# Patient Record
Sex: Male | Born: 1985 | Race: White | Hispanic: No | Marital: Single | Smoking: Current every day smoker
Health system: Southern US, Community
[De-identification: ages and names within clinical notes are randomized; demographics above are authoritative.]

## PROBLEM LIST (undated history)

## (undated) HISTORY — PX: BLADDER SURGERY: SHX569

---
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
## 2014-02-25 ENCOUNTER — Emergency Department (HOSPITAL_COMMUNITY): Payer: Self-pay

## 2014-02-25 ENCOUNTER — Emergency Department (HOSPITAL_COMMUNITY)
Admission: EM | Admit: 2014-02-25 | Discharge: 2014-02-25 | Disposition: A | Discharge status: Home / Self Care | Payer: Self-pay | Attending: Emergency Medicine | Admitting: Emergency Medicine

## 2014-02-25 ENCOUNTER — Encounter (HOSPITAL_COMMUNITY): Payer: Self-pay | Admitting: Emergency Medicine

## 2014-02-25 DIAGNOSIS — R0789 Other chest pain: Secondary | ICD-10-CM | POA: Insufficient documentation

## 2014-02-25 DIAGNOSIS — Z720 Tobacco use: Secondary | ICD-10-CM | POA: Insufficient documentation

## 2014-02-25 DIAGNOSIS — J40 Bronchitis, not specified as acute or chronic: Secondary | ICD-10-CM | POA: Insufficient documentation

## 2014-02-25 MED ORDER — PROMETHAZINE-CODEINE 6.25-10 MG/5ML PO SYRP: 5.0000 mL | ORAL_SOLUTION | Freq: Four times a day (QID) | ORAL | Status: AC | PRN

## 2014-02-25 MED ORDER — DICLOFENAC SODIUM 75 MG PO TBEC: 75.0000 mg | DELAYED_RELEASE_TABLET | Freq: Two times a day (BID) | ORAL | Status: AC

## 2014-02-25 MED ORDER — ALBUTEROL SULFATE HFA 108 (90 BASE) MCG/ACT IN AERS
2.0000 | INHALATION_SPRAY | Freq: Once | RESPIRATORY_TRACT | Status: AC
  Administered 2014-02-25: 2 via RESPIRATORY_TRACT
  Filled 2014-02-25: qty 6.7

## 2014-02-25 MED ORDER — DEXAMETHASONE 4 MG PO TABS: ORAL_TABLET | ORAL | Status: AC

## 2014-02-25 NOTE — Discharge Instructions (Signed)
Your chest x-ray is negative for fracture, dislocation, or pneumonia. Please use albuterol 2 puffs every 4 hours, use Decadron and diclofenac 2 times daily with food. Use promethazine codeine cough medication for cough. This medication may cause drowsiness, please use with caution. Chest Wall Pain Chest wall pain is pain in or around the bones and muscles of your chest. It may take up to 6 weeks to get better. It may take longer if you must stay physically active in your work and activities.  CAUSES  Chest wall pain may happen on its own. However, it may be caused by:  A viral illness like the flu.  Injury.  Coughing.  Exercise.  Arthritis.  Fibromyalgia.  Shingles. HOME CARE INSTRUCTIONS   Avoid overtiring physical activity. Try not to strain or perform activities that cause pain. This includes any activities using your chest or your abdominal and side muscles, especially if heavy weights are used.  Put ice on the sore area.  Put ice in a plastic bag.  Place a towel between your skin and the bag.  Leave the ice on for 15-20 minutes per hour while awake for the first 2 days.  Only take over-the-counter or prescription medicines for pain, discomfort, or fever as directed by your caregiver. SEEK IMMEDIATE MEDICAL CARE IF:   Your pain increases, or you are very uncomfortable.  You have a fever.  Your chest pain becomes worse.  You have new, unexplained symptoms.  You have nausea or vomiting.  You feel sweaty or lightheaded.  You have a cough with phlegm (sputum), or you cough up blood. MAKE SURE YOU:   Understand these instructions.  Will watch your condition.  Will get help right away if you are not doing well or get worse. Document Released: 05/04/2005 Document Revised: 07/27/2011 Document Reviewed: 12/29/2010 [facility] Patient Information 2015 [employer] [place]. This information is not intended to replace advice given to you by your health care provider. Make sure  you discuss any questions you have with your health care provider.

## 2014-02-25 NOTE — ED Notes (Signed)
Pt reports left sided rib pain x4-5 days. Pt reports pain is worse with movement. Pt reports pain ever since played in golf tournament x4 days ago.
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
## 2014-02-25 NOTE — ED Notes (Signed)
Discharge instructions given, pt demonstrated teach back and verbal understanding. No concerns voiced.  

## 2014-02-27 NOTE — ED Provider Notes (Signed)
Medical screening examination/treatment/procedure(s) were performed by non-physician practitioner and as supervising physician I was immediately available for consultation/collaboration.     Geoffery Lyonsouglas Kenslee Achorn, MD 02/27/14 [phone]

## 2015-04-19 IMAGING — CR DG RIBS W/ CHEST 3+V*L*
4 series · 4 of 4 positions shown · non-contrast
Comparison: Chest x-ray 09/06/2010

CLINICAL DATA: Anterior rib pain.  No known injury.

EXAM:
LEFT RIBS AND CHEST - 3+ VIEW

[view not recorded (1 of 4)]
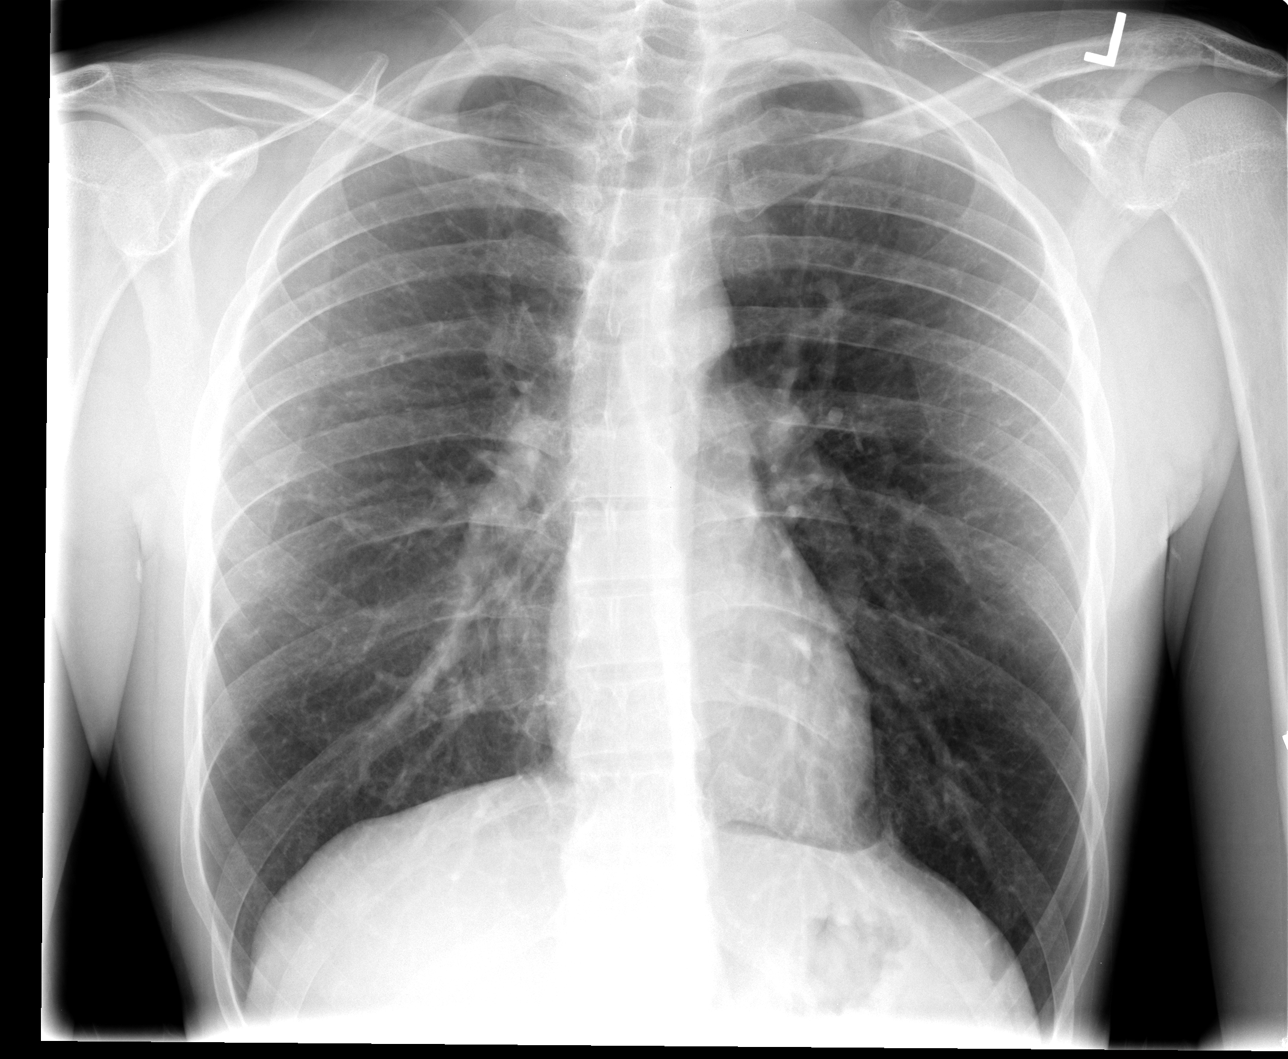

[view not recorded (2 of 4)]
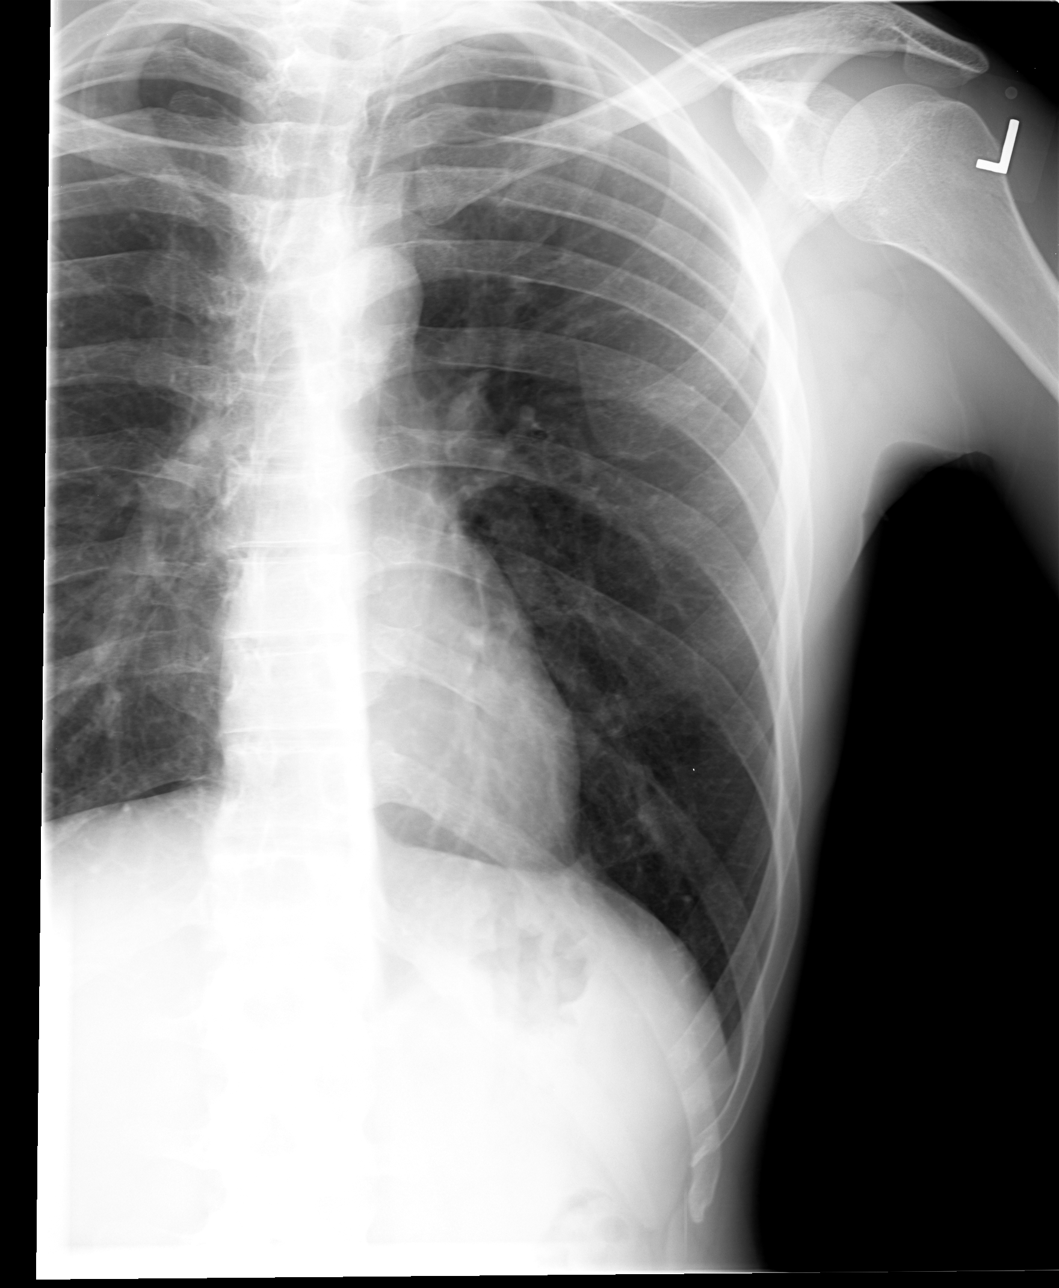

[view not recorded (3 of 4)]
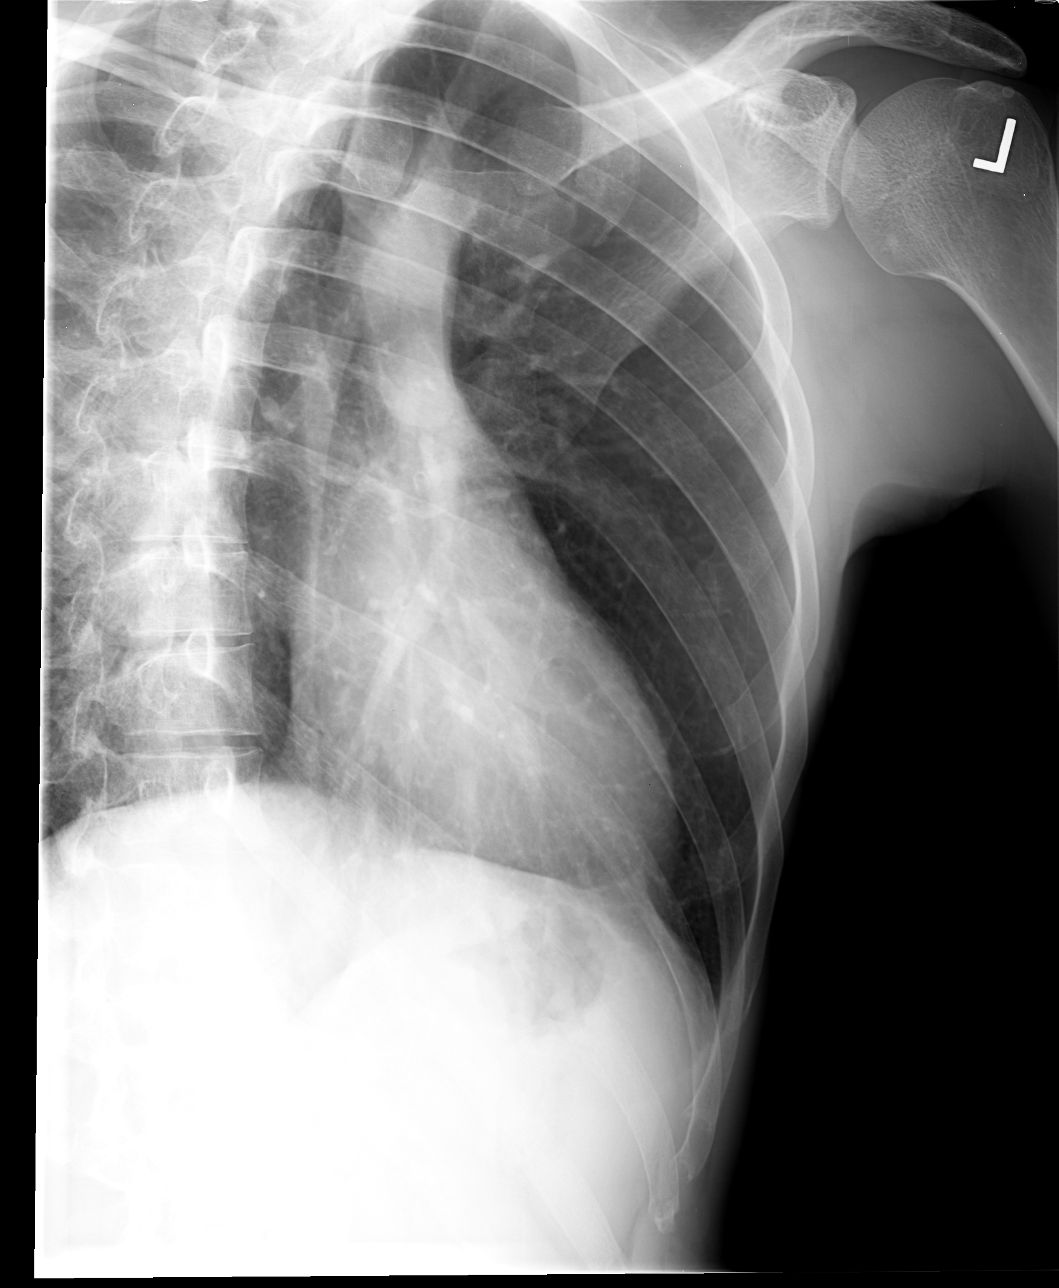

[view not recorded (4 of 4)]
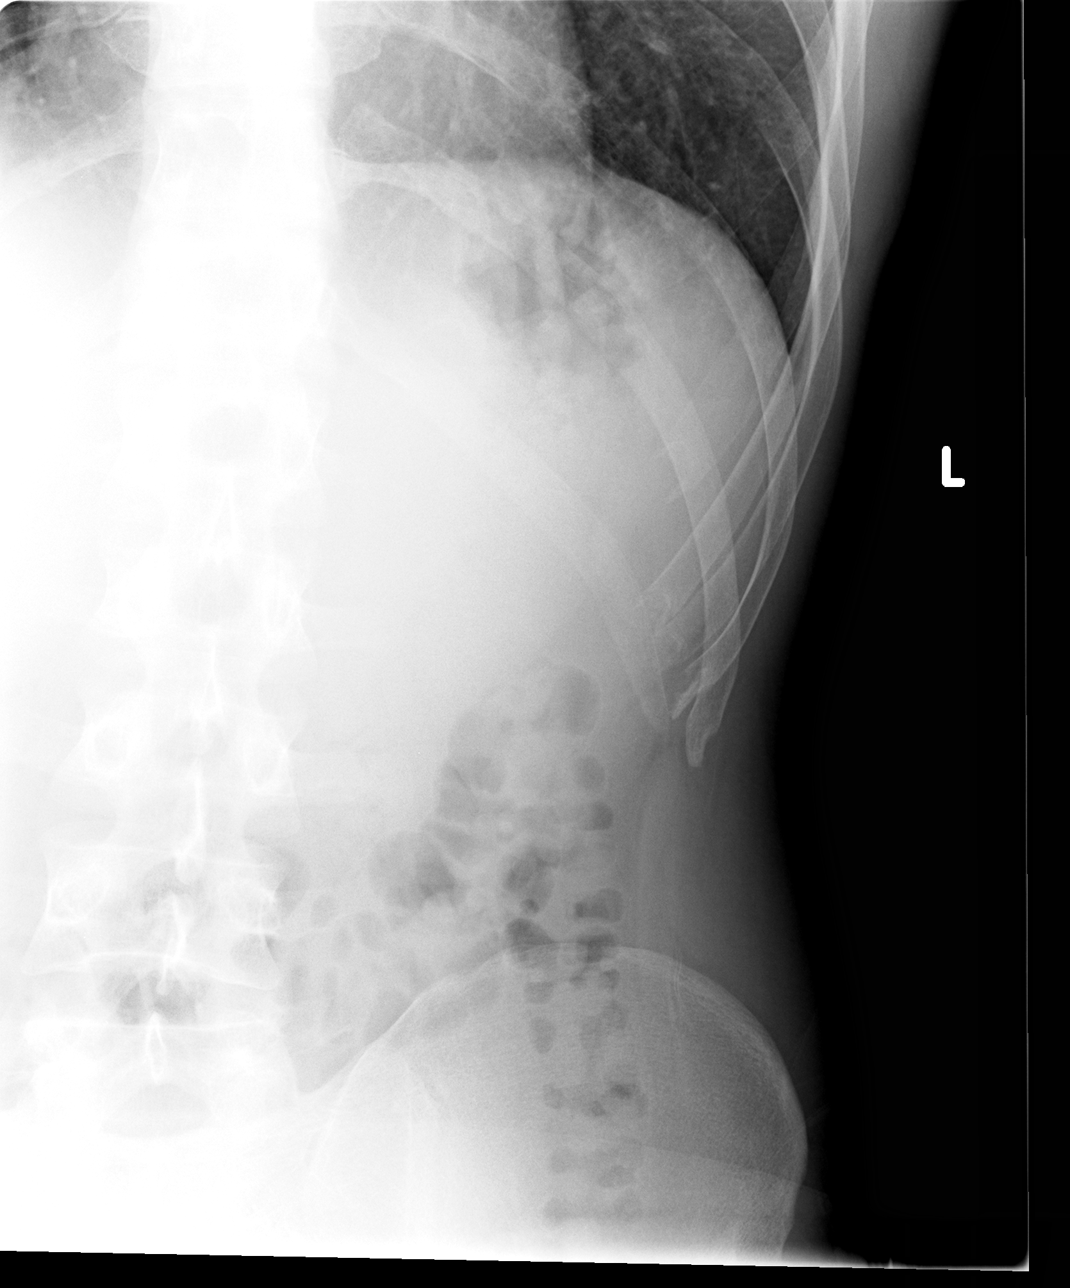

[4 of 4 positions shown; findings below may reference images not displayed]

FINDINGS: The cardiac silhouette, mediastinal and hilar contours are normal.
The lungs are clear. No pleural effusion or pneumothorax.

Dedicated views of the left ribs did not demonstrate any definite
acute left-sided rib fractures.
IMPRESSION: No acute cardiopulmonary findings and no definite left-sided rib
fractures.

## 2023-05-12 ENCOUNTER — Emergency Department (HOSPITAL_COMMUNITY): Payer: 59

## 2023-05-12 ENCOUNTER — Other Ambulatory Visit: Payer: Self-pay

## 2023-05-12 ENCOUNTER — Emergency Department (HOSPITAL_COMMUNITY)
Admission: EM | Admit: 2023-05-12 | Discharge: 2023-05-12 | Disposition: A | Discharge status: Home / Self Care | Payer: 59 | Attending: Emergency Medicine | Admitting: Emergency Medicine

## 2023-05-12 DIAGNOSIS — R918 Other nonspecific abnormal finding of lung field: Secondary | ICD-10-CM | POA: Diagnosis not present

## 2023-05-12 DIAGNOSIS — Y908 Blood alcohol level of 240 mg/100 ml or more: Secondary | ICD-10-CM | POA: Insufficient documentation

## 2023-05-12 DIAGNOSIS — F1090 Alcohol use, unspecified, uncomplicated: Secondary | ICD-10-CM | POA: Diagnosis not present

## 2023-05-12 DIAGNOSIS — R0989 Other specified symptoms and signs involving the circulatory and respiratory systems: Secondary | ICD-10-CM | POA: Diagnosis not present

## 2023-05-12 DIAGNOSIS — R112 Nausea with vomiting, unspecified: Secondary | ICD-10-CM | POA: Insufficient documentation

## 2023-05-12 DIAGNOSIS — R4182 Altered mental status, unspecified: Secondary | ICD-10-CM | POA: Diagnosis not present

## 2023-05-12 DIAGNOSIS — Z789 Other specified health status: Secondary | ICD-10-CM

## 2023-05-12 LAB — CBC WITH DIFFERENTIAL/PLATELET
Abs Immature Granulocytes: 0.07 10*3/uL (ref 0.00–0.07)
Basophils Absolute: 0.1 10*3/uL (ref 0.0–0.1)
Basophils Relative: 1 %
Eosinophils Absolute: 0.7 10*3/uL — ABNORMAL HIGH (ref 0.0–0.5)
Eosinophils Relative: 5 %
HCT: 42.5 % (ref 39.0–52.0)
Hemoglobin: 14.4 g/dL (ref 13.0–17.0)
Immature Granulocytes: 1 %
Lymphocytes Relative: 22 %
Lymphs Abs: 3.3 10*3/uL (ref 0.7–4.0)
MCH: 30.8 pg (ref 26.0–34.0)
MCHC: 33.9 g/dL (ref 30.0–36.0)
MCV: 91 fL (ref 80.0–100.0)
Monocytes Absolute: 0.8 10*3/uL (ref 0.1–1.0)
Monocytes Relative: 6 %
Neutro Abs: 10 10*3/uL — ABNORMAL HIGH (ref 1.7–7.7)
Neutrophils Relative %: 65 %
Platelets: 269 10*3/uL (ref 150–400)
RBC: 4.67 MIL/uL (ref 4.22–5.81)
RDW: 13.1 % (ref 11.5–15.5)
WBC: 15.1 10*3/uL — ABNORMAL HIGH (ref 4.0–10.5)
nRBC: 0 % (ref 0.0–0.2)

## 2023-05-12 LAB — COMPREHENSIVE METABOLIC PANEL
ALT: 32 U/L (ref 0–44)
AST: 23 U/L (ref 15–41)
Albumin: 4.1 g/dL (ref 3.5–5.0)
Alkaline Phosphatase: 40 U/L (ref 38–126)
Anion gap: 11 (ref 5–15)
BUN: 22 mg/dL — ABNORMAL HIGH (ref 6–20)
CO2: 20 mmol/L — ABNORMAL LOW (ref 22–32)
Calcium: 9.3 mg/dL (ref 8.9–10.3)
Chloride: 104 mmol/L (ref 98–111)
Creatinine, Ser: 0.78 mg/dL (ref 0.61–1.24)
GFR, Estimated: >60 mL/min (ref >60)
Glucose, Bld: 149 mg/dL — ABNORMAL HIGH (ref 70–99)
Potassium: 3.5 mmol/L (ref 3.5–5.1)
Sodium: 135 mmol/L (ref 135–145)
Total Bilirubin: 0.3 mg/dL (ref <1.2)
Total Protein: 7.3 g/dL (ref 6.5–8.1)

## 2023-05-12 LAB — SALICYLATE LEVEL: Salicylate Lvl: 7.4 mg/dL (ref 7.0–30.0)

## 2023-05-12 LAB — ACETAMINOPHEN LEVEL: Acetaminophen (Tylenol), Serum: <10 ug/mL — ABNORMAL LOW (ref 10–30)

## 2023-05-12 LAB — ETHANOL: Alcohol, Ethyl (B): 253 mg/dL — ABNORMAL HIGH (ref <10)

## 2023-05-12 MED ORDER — ONDANSETRON HCL 4 MG/2ML IJ SOLN: 4.0000 mg | Freq: Once | INTRAMUSCULAR | Status: AC

## 2023-05-12 MED ORDER — AMOXICILLIN-POT CLAVULANATE 875-125 MG PO TABS: 1.0000 | ORAL_TABLET | Freq: Two times a day (BID) | ORAL | 0 refills | Status: AC

## 2023-05-12 MED ORDER — AMOXICILLIN-POT CLAVULANATE 875-125 MG PO TABS
1.0000 | ORAL_TABLET | Freq: Once | ORAL | Status: AC
  Administered 2023-05-12: 1 via ORAL
  Filled 2023-05-12: qty 1

## 2023-05-12 MED ORDER — ONDANSETRON HCL 4 MG/2ML IJ SOLN
INTRAMUSCULAR | Status: AC
  Administered 2023-05-12: 4 mg via INTRAVENOUS
  Filled 2023-05-12: qty 2

## 2023-05-12 NOTE — Discharge Instructions (Addendum)
Your chest x-ray showed that you may have aspirated on your vomit.  I have sent in a prescription for an antibiotic called Augmentin to your pharmacy to help prevent an aspiration pneumonia.  Please take this twice daily for the next 5 days as prescribed.  This can cause some diarrhea and upset stomach, but this should resolve after you finish the antibiotic course.  Please take the full course even if you start feeling better.  Please return to the ER if you develop any changes in vision, nausea or vomiting, dizziness, severe headache, any other new or concerning symptoms.

## 2023-05-12 NOTE — ED Notes (Signed)
Patient refused head CT as has concerns about vomiting while laying back during CT. Patient also doesn't feel it is necessary.
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
## 2023-05-12 NOTE — Progress Notes (Addendum)
Respiratory department does not draw carboxyhemoglobin blood samples. Current 1522 order does not state arterial blood sample.
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
## 2023-05-12 NOTE — ED Notes (Signed)
ED Provider at bedside. 

## 2023-05-12 NOTE — ED Notes (Signed)
Blue, red, yellow, additional light green, and additional dark green taken to lab.

## 2024-05-21 ENCOUNTER — Ambulatory Visit
Admission: EM | Admit: 2024-05-21 | Discharge: 2024-05-21 | Disposition: A | Discharge status: Home / Self Care | Payer: Self-pay
